# Patient Record
Sex: Male | Born: 1970 | Race: Black or African American | Hispanic: No | Smoking: Never smoker
Health system: Southern US, Community
[De-identification: ages and names within clinical notes are randomized; demographics above are authoritative.]

## PROBLEM LIST (undated history)

## (undated) DIAGNOSIS — G4730 Sleep apnea, unspecified: Secondary | ICD-10-CM

## (undated) DIAGNOSIS — E66813 Obesity, class 3: Secondary | ICD-10-CM

## (undated) DIAGNOSIS — G4733 Obstructive sleep apnea (adult) (pediatric): Principal | ICD-10-CM

## (undated) HISTORY — DX: Sleep apnea, unspecified: G47.30

## (undated) HISTORY — DX: Morbid (severe) obesity due to excess calories: E66.01

## (undated) HISTORY — PX: FIBULA FRACTURE SURGERY: SHX947

## (undated) HISTORY — DX: Obesity, class 3: E66.813

## (undated) HISTORY — DX: Obstructive sleep apnea (adult) (pediatric): G47.33

---

## 2001-12-19 ENCOUNTER — Emergency Department (HOSPITAL_COMMUNITY): Admission: EM | Admit: 2001-12-19 | Discharge: 2001-12-19 | Payer: Self-pay | Admitting: Emergency Medicine

## 2001-12-19 ENCOUNTER — Encounter: Payer: Self-pay | Admitting: Emergency Medicine

## 2004-01-18 ENCOUNTER — Inpatient Hospital Stay (HOSPITAL_COMMUNITY): Admission: EM | Admit: 2004-01-18 | Discharge: 2004-01-30 | Payer: Self-pay | Admitting: *Deleted

## 2004-01-18 ENCOUNTER — Ambulatory Visit: Payer: Self-pay | Admitting: Pulmonary Disease

## 2004-01-28 ENCOUNTER — Ambulatory Visit: Payer: Self-pay | Admitting: Physical Medicine & Rehabilitation

## 2004-01-30 ENCOUNTER — Inpatient Hospital Stay (HOSPITAL_COMMUNITY)
Admission: RE | Admit: 2004-01-30 | Discharge: 2004-02-05 | Payer: Self-pay | Admitting: Physical Medicine & Rehabilitation

## 2004-02-13 ENCOUNTER — Ambulatory Visit: Payer: Self-pay | Admitting: *Deleted

## 2004-02-19 ENCOUNTER — Ambulatory Visit (HOSPITAL_BASED_OUTPATIENT_CLINIC_OR_DEPARTMENT_OTHER): Admission: RE | Admit: 2004-02-19 | Discharge: 2004-02-19 | Payer: Self-pay | Admitting: General Surgery

## 2004-02-19 ENCOUNTER — Ambulatory Visit: Payer: Self-pay | Admitting: Pulmonary Disease

## 2004-06-04 ENCOUNTER — Encounter: Admission: RE | Admit: 2004-06-04 | Discharge: 2004-07-01 | Payer: Self-pay | Admitting: Orthopedic Surgery

## 2004-07-02 ENCOUNTER — Encounter: Admission: RE | Admit: 2004-07-02 | Discharge: 2004-08-17 | Payer: Self-pay | Admitting: Orthopedic Surgery
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
## 2010-06-16 ENCOUNTER — Other Ambulatory Visit: Payer: Self-pay | Admitting: Specialist

## 2010-06-16 DIAGNOSIS — R945 Abnormal results of liver function studies: Secondary | ICD-10-CM

## 2010-06-18 ENCOUNTER — Ambulatory Visit
Admission: RE | Admit: 2010-06-18 | Discharge: 2010-06-18 | Disposition: A | Discharge status: Home / Self Care | Payer: Medicaid Other | Source: Ambulatory Visit | Attending: Specialist | Admitting: Specialist

## 2010-06-18 DIAGNOSIS — R945 Abnormal results of liver function studies: Secondary | ICD-10-CM

## 2010-06-19 LAB — BASIC METABOLIC PANEL: Creatinine: 1 mg/dL (ref 0.6–1.3)

## 2010-07-09 ENCOUNTER — Ambulatory Visit: Payer: Medicaid Other | Admitting: *Deleted

## 2010-07-10 ENCOUNTER — Encounter: Payer: Medicaid Other | Attending: Gastroenterology | Admitting: *Deleted

## 2010-07-10 DIAGNOSIS — E669 Obesity, unspecified: Secondary | ICD-10-CM | POA: Insufficient documentation

## 2010-07-10 DIAGNOSIS — Z713 Dietary counseling and surveillance: Secondary | ICD-10-CM | POA: Insufficient documentation

## 2010-07-14 ENCOUNTER — Other Ambulatory Visit (HOSPITAL_COMMUNITY): Payer: Self-pay | Admitting: Gastroenterology

## 2010-07-14 DIAGNOSIS — R748 Abnormal levels of other serum enzymes: Secondary | ICD-10-CM

## 2010-07-29 ENCOUNTER — Other Ambulatory Visit: Payer: Self-pay | Admitting: Gastroenterology

## 2010-07-29 ENCOUNTER — Ambulatory Visit (HOSPITAL_COMMUNITY)
Admission: RE | Admit: 2010-07-29 | Discharge: 2010-07-29 | Disposition: A | Discharge status: Home / Self Care | Payer: Medicaid Other | Source: Ambulatory Visit | Attending: Gastroenterology | Admitting: Gastroenterology

## 2010-07-29 DIAGNOSIS — R748 Abnormal levels of other serum enzymes: Secondary | ICD-10-CM

## 2010-07-29 DIAGNOSIS — F17200 Nicotine dependence, unspecified, uncomplicated: Secondary | ICD-10-CM | POA: Insufficient documentation

## 2010-07-29 DIAGNOSIS — K759 Inflammatory liver disease, unspecified: Secondary | ICD-10-CM | POA: Insufficient documentation

## 2010-07-29 LAB — CBC
HCT: 38.7 % — ABNORMAL LOW (ref 39.0–52.0)
Hemoglobin: 13.3 g/dL (ref 13.0–17.0)
MCH: 28.2 pg (ref 26.0–34.0)
MCHC: 34.4 g/dL (ref 30.0–36.0)
MCV: 82.2 fL (ref 78.0–100.0)
RBC: 4.71 MIL/uL (ref 4.22–5.81)
RDW: 14.3 % (ref 11.5–15.5)
WBC: 5.6 10*3/uL (ref 4.0–10.5)

## 2010-07-29 LAB — APTT: aPTT: 31 seconds (ref 24–37)

## 2010-07-29 LAB — PROTIME-INR
INR: 0.95 (ref 0.00–1.49)
Prothrombin Time: 12.9 seconds (ref 11.6–15.2)

## 2010-08-04 ENCOUNTER — Ambulatory Visit (HOSPITAL_BASED_OUTPATIENT_CLINIC_OR_DEPARTMENT_OTHER): Payer: Medicaid Other | Attending: Specialist

## 2010-08-04 DIAGNOSIS — G4730 Sleep apnea, unspecified: Secondary | ICD-10-CM | POA: Insufficient documentation

## 2010-08-04 DIAGNOSIS — G4710 Hypersomnia, unspecified: Secondary | ICD-10-CM | POA: Insufficient documentation

## 2010-08-08 DIAGNOSIS — G4710 Hypersomnia, unspecified: Secondary | ICD-10-CM

## 2010-08-08 DIAGNOSIS — G4730 Sleep apnea, unspecified: Secondary | ICD-10-CM

## 2010-08-08 NOTE — Procedures (Signed)
NAME:  Kirk Tanner, Kirk Tanner               ACCOUNT NO.:  [id number]  MEDICAL RECORD NO.:  [id number]          PATIENT TYPE:  OUT  LOCATION:  SLEEP CENTER                 FACILITY:  [facility]  PHYSICIAN:  Ramsay Bognar D. Maple Hudson, MD, FCCP, FACPDATE OF BIRTH:  1970/06/28  DATE OF STUDY:  08/04/2010                           NOCTURNAL POLYSOMNOGRAM  REFERRING PHYSICIAN:  DWIGHT Artelia Laroche  REFERRING PHYSICIAN:  Lerry Liner, MD  INDICATION FOR STUDY:  Hypersomnia with sleep apnea.  EPWORTH SLEEPINESS SCORE:  17/24.  BMI 41.1.  Weight 270 pounds.  Height 68 inches.  Neck 15.5 inches.  MEDICATIONS:  "No medication."  SLEEP ARCHITECTURE:  Total sleep time 338 minutes with sleep efficiency 86.2%.  Stage I was 5.9%, stage II 70.1%, stage III absent, REM 24% of total sleep time.  Sleep latency 48 minutes, REM latency 67.5 minutes, awake after sleep onset 7.5 minutes, arousal index 10.5.  BEDTIME MEDICATION:  None.  RESPIRATORY DATA:  Apnea/hypopnea index (AHI) 1.6 per hour.  A total of 9 events was scored including 1 obstructive apnea and 8 hypopneas. Events were mainly associated with supine sleep position.  REM AHI 1.5 per hour, RDI 4.3.  The study was ordered as a diagnostic NPSG.  OXYGEN DATA:  Moderate snoring with oxygen desaturation to a nadir of 86% and a mean oxygen saturation through the study of 92.8% on room air.  CARDIAC DATA:  Sinus rhythm with rare PAC and PVC.  MOVEMENT-PARASOMNIA:  A few incidental limb jerks with no effect on sleep noted.  No bathroom trips.  IMPRESSIONS-RECOMMENDATIONS: 1. Unremarkable sleep architecture for Sleep Center environment     without medication. 2. Occasional respiratory event with sleep disturbance, within normal     limits, AHI 1.6 per hour (normal range 0-5     per hour).  Mainly supine events with moderate snoring and oxygen     desaturation to a nadir of 86% with mean oxygen saturation through     the study 92.8% on room air.     Clarece Drzewiecki  D. Maple Hudson, MD, [facility], FACP Diplomate, Biomedical engineer of Sleep Medicine Electronically Signed    CDY/MEDQ  D:  08/08/2010 12:39:34  T:  08/08/2010 22:48:56  Job:  [id number]

## 2010-08-17 ENCOUNTER — Ambulatory Visit: Payer: Medicaid Other | Admitting: *Deleted

## 2010-08-24 ENCOUNTER — Ambulatory Visit
Admission: RE | Admit: 2010-08-24 | Discharge: 2010-08-24 | Disposition: A | Discharge status: Home / Self Care | Payer: Medicaid Other | Source: Ambulatory Visit | Attending: Gastroenterology | Admitting: Gastroenterology

## 2010-08-24 ENCOUNTER — Other Ambulatory Visit: Payer: Self-pay | Admitting: Gastroenterology

## 2010-08-24 DIAGNOSIS — D869 Sarcoidosis, unspecified: Secondary | ICD-10-CM

## 2010-08-31 ENCOUNTER — Ambulatory Visit: Payer: Medicaid Other | Admitting: *Deleted

## 2010-09-01 ENCOUNTER — Ambulatory Visit: Payer: Medicaid Other | Admitting: *Deleted

## 2010-09-02 ENCOUNTER — Encounter: Payer: Self-pay | Admitting: *Deleted

## 2010-11-17 ENCOUNTER — Ambulatory Visit: Payer: Medicaid Other | Admitting: *Deleted

## 2010-12-18 ENCOUNTER — Ambulatory Visit: Payer: Medicaid Other | Admitting: *Deleted

## 2010-12-23 ENCOUNTER — Encounter (HOSPITAL_COMMUNITY): Payer: Self-pay

## 2010-12-23 ENCOUNTER — Emergency Department (HOSPITAL_COMMUNITY)
Admission: EM | Admit: 2010-12-23 | Discharge: 2010-12-23 | Disposition: A | Discharge status: Home / Self Care | Payer: Medicaid Other | Source: Home / Self Care | Attending: Family Medicine | Admitting: Family Medicine

## 2010-12-23 DIAGNOSIS — T07XXXA Unspecified multiple injuries, initial encounter: Secondary | ICD-10-CM

## 2010-12-23 DIAGNOSIS — IMO0002 Reserved for concepts with insufficient information to code with codable children: Secondary | ICD-10-CM

## 2010-12-23 MED ORDER — TETANUS-DIPHTH-ACELL PERTUSSIS 5-2.5-18.5 LF-MCG/0.5 IM SUSP
0.5000 mL | Freq: Once | INTRAMUSCULAR | Status: AC
  Administered 2010-12-23: 0.5 mL via INTRAMUSCULAR

## 2010-12-23 MED ORDER — TETANUS-DIPHTH-ACELL PERTUSSIS 5-2.5-18.5 LF-MCG/0.5 IM SUSP
INTRAMUSCULAR | Status: AC
  Filled 2010-12-23: qty 0.5

## 2010-12-23 MED ORDER — AMOXICILLIN 500 MG PO CAPS: 500.0000 mg | ORAL_CAPSULE | Freq: Three times a day (TID) | ORAL | Status: AC
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
## 2011-01-12 ENCOUNTER — Emergency Department (HOSPITAL_COMMUNITY)
Admission: EM | Admit: 2011-01-12 | Discharge: 2011-01-12 | Disposition: A | Discharge status: Home / Self Care | Payer: Medicaid Other | Source: Home / Self Care | Attending: Family Medicine | Admitting: Family Medicine

## 2011-01-12 ENCOUNTER — Encounter (HOSPITAL_COMMUNITY): Payer: Self-pay | Admitting: Cardiology

## 2011-01-12 DIAGNOSIS — S0180XA Unspecified open wound of other part of head, initial encounter: Secondary | ICD-10-CM

## 2011-01-12 DIAGNOSIS — S01111A Laceration without foreign body of right eyelid and periocular area, initial encounter: Secondary | ICD-10-CM
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
## 2011-01-12 NOTE — ED Provider Notes (Signed)
History     CSN: [id number] Arrival date & time: 01/12/2011  9:47 AM   First MD Initiated Contact with Patient 01/12/11 1026      Chief Complaint  Patient presents with  . Facial Laceration    (Consider location/radiation/quality/duration/timing/severity/associated sxs/prior treatment) Patient is a 40 y.o. male presenting with skin laceration. The history is provided by the patient.  Laceration  The incident occurred 1 to 2 hours ago. The laceration is located on the right eye. The laceration is 2 cm in size. The laceration mechanism was a a metal edge. The patient is experiencing no pain. He reports no foreign bodies present. His tetanus status is UTD.    Past Medical History  Diagnosis Date  . Sleep apnea   . Obesity, Class III, BMI 40-49.9 (morbid obesity)     Past Surgical History  Procedure Date  . Fibula fracture surgery     Right fibula surgery    Family History  Problem Relation Age of Onset  . Diabetes Mother   . Diabetes Father     History  Substance Use Topics  . Smoking status: Never Smoker   . Smokeless tobacco: Not on file  . Alcohol Use: Yes     Occasional ETOH use      Review of Systems  Constitutional: Negative.   Skin: Positive for wound.    Allergies  Review of patient's allergies indicates no known allergies.  Home Medications   Current Outpatient Rx  Name Route Sig Dispense Refill  . HYDROXYZINE PAMOATE 50 MG PO CAPS Oral Take 50 mg by mouth every 8 (eight) hours as needed. 1-2 caps q8h prn     . LORATADINE 10 MG PO TABS Oral Take 10 mg by mouth daily.        BP 130/88  Pulse 89  Temp(Src) 98.2 F (36.8 C) (Oral)  Resp 20  SpO2 99%  Physical Exam  Constitutional: He appears well-developed and well-nourished.  HENT:  Head: Normocephalic.  Eyes: Conjunctivae and EOM are normal. Pupils are equal, round, and reactive to light.  Skin: Skin is warm and dry.       ED Course  LACERATION REPAIR Date/Time: 01/12/2011 10:48  AM Performed by: Barkley Bruns Authorized by: Barkley Bruns Consent: Verbal consent obtained. Consent given by: patient Imaging studies: imaging studies not available Patient identity confirmed: verbally with patient Body area: head/neck Location details: right eyebrow Laceration length: 1.5 cm Foreign bodies: no foreign bodies Nerve involvement: none Vascular damage: no Patient sedated: no Irrigation solution: tap water Amount of cleaning: standard Debridement: none Degree of undermining: none Skin closure: glue Approximation: close Approximation difficulty: simple Patient tolerance: Patient tolerated the procedure well with no immediate complications.   (including critical care time)  Labs Reviewed - No data to display No results found.   1. Laceration of eyebrow, right       MDM         Barkley Bruns, MD 01/12/11 1055

## 2013-04-03 ENCOUNTER — Ambulatory Visit (INDEPENDENT_AMBULATORY_CARE_PROVIDER_SITE_OTHER): Payer: 59 | Admitting: Neurology

## 2013-04-03 ENCOUNTER — Encounter: Payer: Self-pay | Admitting: Neurology

## 2013-04-03 ENCOUNTER — Encounter (INDEPENDENT_AMBULATORY_CARE_PROVIDER_SITE_OTHER): Payer: Self-pay

## 2013-04-03 VITALS — BP 143/99 | HR 92 | Temp 98.8°F | Ht 68.0 in | Wt 386.0 lb

## 2013-04-03 DIAGNOSIS — G4733 Obstructive sleep apnea (adult) (pediatric): Secondary | ICD-10-CM

## 2013-04-03 DIAGNOSIS — G4761 Periodic limb movement disorder: Secondary | ICD-10-CM

## 2013-04-03 HISTORY — DX: Obstructive sleep apnea (adult) (pediatric): G47.33
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
## 2013-04-03 NOTE — Patient Instructions (Signed)

## 2013-04-18 ENCOUNTER — Ambulatory Visit (INDEPENDENT_AMBULATORY_CARE_PROVIDER_SITE_OTHER): Payer: 59

## 2013-04-18 DIAGNOSIS — G4710 Hypersomnia, unspecified: Secondary | ICD-10-CM

## 2013-04-18 DIAGNOSIS — G4730 Sleep apnea, unspecified: Secondary | ICD-10-CM

## 2013-04-18 DIAGNOSIS — G4761 Periodic limb movement disorder: Secondary | ICD-10-CM

## 2013-04-18 DIAGNOSIS — G4733 Obstructive sleep apnea (adult) (pediatric): Secondary | ICD-10-CM

## 2013-04-20 ENCOUNTER — Telehealth: Payer: Self-pay | Admitting: Neurology

## 2013-04-20 DIAGNOSIS — G4761 Periodic limb movement disorder: Secondary | ICD-10-CM

## 2013-04-20 DIAGNOSIS — R9431 Abnormal electrocardiogram [ECG] [EKG]: Secondary | ICD-10-CM

## 2013-04-20 DIAGNOSIS — G4733 Obstructive sleep apnea (adult) (pediatric): Secondary | ICD-10-CM

## 2013-04-20 DIAGNOSIS — G4734 Idiopathic sleep related nonobstructive alveolar hypoventilation: Secondary | ICD-10-CM

## 2013-04-20 NOTE — Telephone Encounter (Signed)
Please call and notify patient that the recent sleep study confirmed the diagnosis of OSA. He did very well with BiPAP during the study with significant improvement of the respiratory events. Therefore, I would like start the patient on BiPAP at home. I placed the order in the chart.   Arrange for PAP set up at home through a DME company of patient's choice and fax/route report to PCP and referring MD (if other than PCP).   The patient will also need a follow up appointment with me in 6-8 weeks post set up that has to be scheduled; help the patient schedule this (in a follow-up slot).   Please re-enforce the importance of compliance with treatment and the need for us to monitor compliance data.   Once you have spoken to the patient and scheduled the return appointment, you may close this encounter, thanks,   Huston FoleySaima Kieara Schwark, MD, PhD [facility] ([facility])

## 2013-04-23 NOTE — Telephone Encounter (Signed)
I tried calling the patient twice. Once on Friday at 4:30pm and this morning to discuss his sleep study results. Patient has a voice mail that has not been set-up, I was unable to leave a message.

## 2013-04-23 NOTE — Telephone Encounter (Signed)
I tried calling the patient to give sleep study results but unable to leave message

## 2013-04-24 ENCOUNTER — Encounter: Payer: Self-pay | Admitting: *Deleted

## 2013-04-24 NOTE — Telephone Encounter (Signed)
I called patient to discuss his sleep study results but was unable to contact patient or leave a message. Patient does not have a voice mail set-up. I will mail his results and send the order to [facility]. I will fax a copy of the report to Dr. Benjie Karvonenavis Cloward's office and mail a copy of the report along with a follow up instruction letter to the patient.

## 2013-07-09 ENCOUNTER — Ambulatory Visit: Payer: 59 | Admitting: Neurology

## 2013-07-23 ENCOUNTER — Telehealth: Payer: Self-pay | Admitting: *Deleted

## 2013-07-23 ENCOUNTER — Ambulatory Visit: Payer: 59 | Admitting: Neurology

## 2013-07-23 NOTE — Telephone Encounter (Signed)
Attempted to contact pt. to cancel today's appt. due to Dr. Teofilo PodAthar's absence.  I got a recorded message that VM has not been set-up yet.

## 2013-07-25 ENCOUNTER — Ambulatory Visit (INDEPENDENT_AMBULATORY_CARE_PROVIDER_SITE_OTHER): Payer: 59 | Admitting: Neurology

## 2013-07-25 ENCOUNTER — Encounter: Payer: Self-pay | Admitting: Neurology

## 2013-07-25 VITALS — BP 133/86 | HR 83 | Temp 97.8°F | Ht 68.0 in | Wt 385.0 lb

## 2013-07-25 DIAGNOSIS — G4761 Periodic limb movement disorder: Secondary | ICD-10-CM

## 2013-07-25 DIAGNOSIS — R9431 Abnormal electrocardiogram [ECG] [EKG]: Secondary | ICD-10-CM

## 2013-07-25 DIAGNOSIS — G4733 Obstructive sleep apnea (adult) (pediatric): Secondary | ICD-10-CM

## 2013-07-25 DIAGNOSIS — G4734 Idiopathic sleep related nonobstructive alveolar hypoventilation: Secondary | ICD-10-CM
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
## 2013-07-25 NOTE — Patient Instructions (Addendum)
Please continue using your BiPAP regularly. While your insurance requires that you use PAP at least 4 hours each night on 70% of the nights, I recommend, that you not skip any nights and use it throughout the night if you can. Getting used to PAP and staying with the treatment long term does take time and patience and discipline. Untreated obstructive sleep apnea when it is moderate to severe can have an adverse impact on cardiovascular health and raise her risk for heart disease, arrhythmias, hypertension, congestive heart failure, stroke and diabetes. Untreated obstructive sleep apnea causes sleep disruption, nonrestorative sleep, and sleep deprivation. This can have an impact on your day to day functioning and cause daytime sleepiness and impairment of cognitive function, memory loss, mood disturbance, and problems focussing. Using PAP regularly can improve these symptoms.  I will see you back in 6 months, we will try to get you a better mask, new supplies. [facility]

## 2013-09-11 ENCOUNTER — Other Ambulatory Visit: Payer: BC Managed Care – PPO

## 2013-12-12 ENCOUNTER — Telehealth: Payer: Self-pay | Admitting: *Deleted

## 2013-12-12 ENCOUNTER — Encounter: Payer: Self-pay | Admitting: *Deleted

## 2013-12-12 NOTE — Telephone Encounter (Signed)
Called pt to resched apt. Phone number is not in working order. Will send letter with new apt date and time. Will request pt to call office if the apt does not fit his schedule.

## 2014-01-16 ENCOUNTER — Telehealth: Payer: Self-pay | Admitting: Neurology

## 2014-01-16 ENCOUNTER — Telehealth: Payer: Self-pay | Admitting: *Deleted

## 2014-01-16 NOTE — Telephone Encounter (Signed)
Called patient as a reminder to bring CPAP for upcoming visit, number listed is not a working number at this time

## 2014-01-16 NOTE — Telephone Encounter (Signed)
[facility] and Kirk Tanner stated that she will send a message to have a recent one faxed to our office asap.

## 2014-01-16 NOTE — Telephone Encounter (Signed)
Received [facility] from [facility], gave to Dr Frances FurbishAthar for review

## 2014-01-16 NOTE — Telephone Encounter (Signed)
Spoke with wife since unable to reach patient to give message to bring machine for download for visit  On 01/17/14, she verbalized understanding and said that she will give patient message

## 2014-01-17 ENCOUNTER — Encounter: Payer: Self-pay | Admitting: Neurology

## 2014-01-17 ENCOUNTER — Ambulatory Visit (INDEPENDENT_AMBULATORY_CARE_PROVIDER_SITE_OTHER): Payer: Self-pay | Admitting: Neurology

## 2014-01-17 VITALS — BP 123/84 | HR 80 | Temp 98.3°F | Ht 68.0 in | Wt 392.0 lb

## 2014-01-17 DIAGNOSIS — G4733 Obstructive sleep apnea (adult) (pediatric): Secondary | ICD-10-CM

## 2014-01-17 DIAGNOSIS — Z9989 Dependence on other enabling machines and devices: Principal | ICD-10-CM

## 2014-01-17 NOTE — Patient Instructions (Signed)
Please continue using your BiPAP regularly. While your insurance requires that you use BiPAP at least 4 hours each night on 70% of the nights, I recommend, that you not skip any nights and use it throughout the night if you can. Getting used to BiPAP and staying with the treatment long term does take time and patience and discipline. Untreated obstructive sleep apnea when it is moderate to severe can have an adverse impact on cardiovascular health and raise her risk for heart disease, arrhythmias, hypertension, congestive heart failure, stroke and diabetes. Untreated obstructive sleep apnea causes sleep disruption, nonrestorative sleep, and sleep deprivation. This can have an impact on your day to day functioning and cause daytime sleepiness and impairment of cognitive function, memory loss, mood disturbance, and problems focussing. Using BiPAP regularly can improve these symptoms.  Keep up the good work! I will see you back in 12 months for sleep apnea check up, please work on your weight loss.
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
## 2014-01-28 ENCOUNTER — Ambulatory Visit: Payer: 59 | Admitting: Neurology

## 2014-02-16 ENCOUNTER — Encounter: Payer: Self-pay | Admitting: Neurology

## 2014-09-10 ENCOUNTER — Telehealth: Payer: Self-pay | Admitting: Neurology

## 2014-09-10 NOTE — Telephone Encounter (Signed)
This is the message that I received from [place] at [facility]...  Patient is all good now. I see that manager of the billing dept put the below note in our system today:  removed patient from collections - he has active medicaid since September 2015, qualifies for financial assistance and we have been paid for services through [facility] [place] [facility].   Thanks  [employer]

## 2014-09-10 NOTE — Telephone Encounter (Signed)
Pt is upset because [facility] is telling him that they don't have his Medicaid on file and they have sent him to a collection agency saying he owes $900. He said he was in Dr. Teofilo Pod office when Dr. Frances Furbish put the order in for him for mask, tubing and new filters. Pt needs this straightened out. Mask is now slipping off his face at night and filter is dirty. Not sure what [facility] needs to pay for that. Dr. Frances Furbish gave the order and Medicaid info when she talked with them.

## 2014-09-10 NOTE — Telephone Encounter (Signed)
I spoke to patient and he advised me that [facility] did speak with him and that everything is taken care of now.

## 2014-09-10 NOTE — Telephone Encounter (Signed)
I just sent Betsy with [facility] a note asking her to look into this for [place].

## 2015-01-16 ENCOUNTER — Telehealth: Payer: Self-pay

## 2015-01-16 ENCOUNTER — Ambulatory Visit: Payer: Self-pay | Admitting: Neurology

## 2015-01-16 NOTE — Telephone Encounter (Signed)
Patient did not show to appt today  

## 2015-01-20 ENCOUNTER — Encounter: Payer: Self-pay | Admitting: Neurology

## 2015-03-19 ENCOUNTER — Emergency Department (HOSPITAL_COMMUNITY): Payer: Self-pay

## 2015-03-19 ENCOUNTER — Encounter (HOSPITAL_COMMUNITY): Payer: Self-pay | Admitting: Emergency Medicine

## 2015-03-19 ENCOUNTER — Emergency Department (HOSPITAL_COMMUNITY)
Admission: EM | Admit: 2015-03-19 | Discharge: 2015-03-20 | Disposition: A | Discharge status: Home / Self Care | Payer: Self-pay | Attending: Emergency Medicine | Admitting: Emergency Medicine

## 2015-03-19 DIAGNOSIS — Z8669 Personal history of other diseases of the nervous system and sense organs: Secondary | ICD-10-CM | POA: Insufficient documentation

## 2015-03-19 DIAGNOSIS — R1012 Left upper quadrant pain: Secondary | ICD-10-CM | POA: Insufficient documentation

## 2015-03-19 DIAGNOSIS — R109 Unspecified abdominal pain: Secondary | ICD-10-CM

## 2015-03-19 DIAGNOSIS — R1032 Left lower quadrant pain: Secondary | ICD-10-CM | POA: Insufficient documentation

## 2015-03-19 LAB — COMPREHENSIVE METABOLIC PANEL
ALK PHOS: 224 U/L — AB (ref 38–126)
ALT: 84 U/L — ABNORMAL HIGH (ref 17–63)
ANION GAP: 9 (ref 5–15)
AST: 65 U/L — ABNORMAL HIGH (ref 15–41)
Albumin: 4.4 g/dL (ref 3.5–5.0)
BILIRUBIN TOTAL: 0.7 mg/dL (ref 0.3–1.2)
BUN: 17 mg/dL (ref 6–20)
CALCIUM: 10.1 mg/dL (ref 8.9–10.3)
CO2: 29 mmol/L (ref 22–32)
Chloride: 102 mmol/L (ref 101–111)
Creatinine, Ser: 1.47 mg/dL — ABNORMAL HIGH (ref 0.61–1.24)
GFR calc non Af Amer: 56 mL/min — ABNORMAL LOW (ref >60)
Glucose, Bld: 112 mg/dL — ABNORMAL HIGH (ref 65–99)
POTASSIUM: 4.2 mmol/L (ref 3.5–5.1)
SODIUM: 140 mmol/L (ref 135–145)
TOTAL PROTEIN: 8.8 g/dL — AB (ref 6.5–8.1)

## 2015-03-19 LAB — CBC
HCT: 49.9 % (ref 39.0–52.0)
HEMOGLOBIN: 17.1 g/dL — AB (ref 13.0–17.0)
MCH: 29.6 pg (ref 26.0–34.0)
MCHC: 34.3 g/dL (ref 30.0–36.0)
MCV: 86.3 fL (ref 78.0–100.0)
Platelets: 176 10*3/uL (ref 150–400)
RBC: 5.78 MIL/uL (ref 4.22–5.81)
RDW: 13.5 % (ref 11.5–15.5)
WBC: 9.3 10*3/uL (ref 4.0–10.5)

## 2015-03-19 LAB — URINALYSIS, ROUTINE W REFLEX MICROSCOPIC
Bilirubin Urine: NEGATIVE
Glucose, UA: NEGATIVE mg/dL
Hgb urine dipstick: NEGATIVE
Ketones, ur: NEGATIVE mg/dL
Leukocytes, UA: NEGATIVE
NITRITE: NEGATIVE
Protein, ur: NEGATIVE mg/dL
SPECIFIC GRAVITY, URINE: 1.023 (ref 1.005–1.030)
pH: 5.5 (ref 5.0–8.0)

## 2015-03-19 LAB — LIPASE, BLOOD: Lipase: 40 U/L (ref 11–51)

## 2015-03-19 MED ORDER — SODIUM CHLORIDE 0.9 % IV BOLUS (SEPSIS)
1000.0000 mL | Freq: Once | INTRAVENOUS | Status: AC
  Administered 2015-03-19: 1000 mL via INTRAVENOUS

## 2015-03-19 NOTE — ED Provider Notes (Addendum)
CSN: [id number]     Arrival date & time 03/19/15  1920 History  By signing my name below, I, Budd Palmer, attest that this documentation has been prepared under the direction and in the presence of Gilda Crease, MD. Electronically Signed: Budd Palmer, ED Scribe. 03/19/2015. 11:37 PM.     Chief Complaint  Patient presents with  . Abdominal Pain   The history is provided by the patient. No language interpreter was used.   HPI Comments: Kirk Tanner is a 45 y.o. male who presents to the Emergency Department complaining of intermittent, worsening, left-sided and lower abdominal pain onset 2 months ago, worsening significantly as of 2 weeks ago and turning constant as of this week. Pt states he was seen for this at onset by his PCP, whom he called today to find out that all his test results were "okay." He notes the pain is currently bearable without medication. He states that at first the pain only occurred after eating fried or spicy foods. He then stopped eating those foods, which seemed to work for a time, until the pain began to worsen once more. He has taken a laxative yesterday without any relief of the pain. He notes that drinking a pint of milk seemed to alleviate the pain. He reports a PMHx of high liver values. He notes he has had an [place] and a liver biopsy for this. He denies a PMHx of GERD and gastric ulcers. He denies a PSHx on the abdomen. Pt denies bloody or black stool, and n/v/d.    Past Medical History  Diagnosis Date  . Sleep apnea   . Obesity, Class III, BMI 40-49.9 (morbid obesity) (HCC)   . OSA (obstructive sleep apnea) 04/03/2013   Past Surgical History  Procedure Laterality Date  . Fibula fracture surgery      Right fibula surgery   Family History  Problem Relation Age of Onset  . Diabetes Mother   . Diabetes Father   . Hypertension Other    Social History  Substance Use Topics  . Smoking status: Never Smoker   . Smokeless tobacco: Never Used  .  Alcohol Use: 0.0 oz/week    0 Standard drinks or equivalent per week     Comment: Occasional ETOH use    Review of Systems  Gastrointestinal: Positive for abdominal pain. Negative for nausea, vomiting, diarrhea, constipation and blood in stool.  All other systems reviewed and are negative.   Allergies  Review of patient's allergies indicates no known allergies.  Home Medications   Prior to Admission medications   Medication Sig Start Date End Date Taking? Authorizing Provider  bismuth subsalicylate (PEPTO BISMOL) 262 MG chewable tablet Chew 524 mg by mouth daily as needed for indigestion or diarrhea or loose stools.   Yes Historical Provider, MD  bismuth subsalicylate (PEPTO BISMOL) 262 MG/15ML suspension Take 30 mLs by mouth every 6 (six) hours as needed for indigestion or diarrhea or loose stools.   Yes Historical Provider, MD   BP 147/106 mmHg  Pulse 81  Temp(Src) 98.2 F (36.8 C) (Oral)  Resp 19  SpO2 99% Physical Exam  Constitutional: He is oriented to person, place, and time. He appears well-developed and well-nourished. No distress.  HENT:  Head: Normocephalic and atraumatic.  Right Ear: Hearing normal.  Left Ear: Hearing normal.  Nose: Nose normal.  Mouth/Throat: Oropharynx is clear and moist and mucous membranes are normal.  Eyes: Conjunctivae and EOM are normal. Pupils are equal, round, and reactive  to light.  Neck: Normal range of motion. Neck supple.  Cardiovascular: Regular rhythm, S1 normal and S2 normal.  Exam reveals no gallop and no friction rub.   No murmur heard. Pulmonary/Chest: Effort normal and breath sounds normal. No respiratory distress. He exhibits no tenderness.  Abdominal: Soft. Normal appearance and bowel sounds are normal. There is no hepatosplenomegaly. There is tenderness. There is no rebound, no guarding, no tenderness at McBurney's point and negative Murphy's sign. No hernia.  Mild TTP in LUQ and LLQ  Musculoskeletal: Normal range of motion.   Neurological: He is alert and oriented to person, place, and time. He has normal strength. No cranial nerve deficit or sensory deficit. Coordination normal. GCS eye subscore is 4. GCS verbal subscore is 5. GCS motor subscore is 6.  Skin: Skin is warm, dry and intact. No rash noted. No cyanosis.  Psychiatric: He has a normal mood and affect. His speech is normal and behavior is normal. Thought content normal.  Nursing note and vitals reviewed.   ED Course  Procedures  DIAGNOSTIC STUDIES: Oxygen Saturation is 99% on RA, normal by my interpretation.    COORDINATION OF CARE: 11:16 PM - Discussed plans to order diagnostic studies and imaging to r/o diverticulitis. Pt advised of plan for treatment and pt agrees.  Labs Review Labs Reviewed  COMPREHENSIVE METABOLIC PANEL - Abnormal; Notable for the following:    Glucose, Bld 112 (*)    Creatinine, Ser 1.47 (*)    Total Protein 8.8 (*)    AST 65 (*)    ALT 84 (*)    Alkaline Phosphatase 224 (*)    GFR calc non Af Amer 56 (*)    All other components within normal limits  CBC - Abnormal; Notable for the following:    Hemoglobin 17.1 (*)    All other components within normal limits  LIPASE, BLOOD  URINALYSIS, ROUTINE W REFLEX MICROSCOPIC (NOT AT [facility])    Imaging Review No results found. I have personally reviewed and evaluated these images and lab results as part of my medical decision-making.   EKG Interpretation None      MDM   Final diagnoses:  None  abdominal pain  Patient presents to the emergency department for evaluation of abdominal pain. Patient reports that he had been noticing intermittent left-sided abdominal pain for 2 months, but for the last week it has been constant. He initially thought it was worsened by spicy foods, but now will go away. He has not had associated nausea, vomiting or diarrhea. Examination was benign. Mild left-sided tenderness, but no guarding or rebound. Lab work was unremarkable. His LFTs are  mildly elevated, but he reports that this has been chronic. He has been worked up for this including liver biopsy in the past. CT scan performed, no signs of diverticulitis, inflammation or obstruction. Patient might benefit from endoscopy, will treat with acid blocker and follow-up with gastroenterology.  Medical screening examination/treatment/procedure(s) were performed by non-physician practitioner and as supervising physician I was immediately available for consultation/collaboration.   EKG Interpretation None        Gilda Crease, MD 03/20/15 0030  Gilda Crease, MD 03/20/15 [phone]

## 2015-03-19 NOTE — ED Notes (Signed)
Pt denies n/v or diarrhea.

## 2015-03-19 NOTE — ED Notes (Signed)
Pt states he has been having intermittent abd pain for the past two months  Pt states he was seen by his PCP a couple months ago and had some test done  Pt states the pain has continued off and on since then  Pt states he called his dr back today and they told him all the tests came back okay  Pt states he continues to have pain  Pt states the pain is on the left side  Pt states he took a laxative last night without relief

## 2015-03-20 MED ORDER — RANITIDINE HCL 150 MG PO TABS: 150.0000 mg | ORAL_TABLET | Freq: Two times a day (BID) | ORAL | Status: AC

## 2015-03-20 MED ORDER — IOHEXOL 300 MG/ML  SOLN
100.0000 mL | Freq: Once | INTRAMUSCULAR | Status: AC | PRN
  Administered 2015-03-20: 100 mL via INTRAVENOUS

## 2015-03-20 MED ORDER — SUCRALFATE 1 GM/10ML PO SUSP: 1.0000 g | Freq: Three times a day (TID) | ORAL | Status: AC

## 2015-03-20 NOTE — ED Notes (Signed)
Patient transported to CT 

## 2015-03-20 NOTE — Discharge Instructions (Signed)
